# Patient Record
Sex: Male | Born: 1985 | Race: Black or African American | Hispanic: No | Marital: Married
Health system: Southern US, Community
[De-identification: ages and names within clinical notes are randomized; demographics above are authoritative.]

---

## 2019-10-18 ENCOUNTER — Emergency Department (HOSPITAL_COMMUNITY): Payer: PRIVATE HEALTH INSURANCE

## 2019-10-18 ENCOUNTER — Other Ambulatory Visit: Payer: Self-pay

## 2019-10-18 ENCOUNTER — Encounter (HOSPITAL_COMMUNITY): Payer: Self-pay | Admitting: Student

## 2019-10-18 ENCOUNTER — Emergency Department (HOSPITAL_COMMUNITY)
Admission: EM | Admit: 2019-10-18 | Discharge: 2019-10-18 | Disposition: A | Discharge status: Home / Self Care | Payer: PRIVATE HEALTH INSURANCE | Attending: Emergency Medicine | Admitting: Emergency Medicine

## 2019-10-18 DIAGNOSIS — M542 Cervicalgia: Secondary | ICD-10-CM | POA: Diagnosis present

## 2019-10-18 DIAGNOSIS — M5489 Other dorsalgia: Secondary | ICD-10-CM | POA: Insufficient documentation

## 2019-10-18 DIAGNOSIS — Y92410 Unspecified street and highway as the place of occurrence of the external cause: Secondary | ICD-10-CM | POA: Diagnosis not present

## 2019-10-18 DIAGNOSIS — Y999 Unspecified external cause status: Secondary | ICD-10-CM | POA: Insufficient documentation

## 2019-10-18 DIAGNOSIS — Y9389 Activity, other specified: Secondary | ICD-10-CM | POA: Diagnosis not present

## 2019-10-18 MED ORDER — OXYCODONE-ACETAMINOPHEN 5-325 MG PO TABS
1.0000 | ORAL_TABLET | Freq: Once | ORAL | Status: AC
  Administered 2019-10-18: 1 via ORAL
  Filled 2019-10-18: qty 1

## 2019-10-18 MED ORDER — TETANUS-DIPHTH-ACELL PERTUSSIS 5-2.5-18.5 LF-MCG/0.5 IM SUSP
0.5000 mL | Freq: Once | INTRAMUSCULAR | Status: AC
  Administered 2019-10-18: 0.5 mL via INTRAMUSCULAR
  Filled 2019-10-18: qty 0.5

## 2019-10-18 MED ORDER — NAPROXEN 500 MG PO TABS: 500.0000 mg | ORAL_TABLET | Freq: Two times a day (BID) | ORAL | 0 refills | Status: AC

## 2019-10-18 MED ORDER — METHOCARBAMOL 500 MG PO TABS
500.0000 mg | ORAL_TABLET | Freq: Three times a day (TID) | ORAL | 0 refills | Status: AC | PRN
Start: 2019-10-18 — End: ?
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
